# Patient Record
Sex: Male | Born: 1960 | Race: White | Hispanic: No | Marital: Single | Smoking: Current every day smoker
Health system: Southern US, Community
[De-identification: ages and names within clinical notes are randomized; demographics above are authoritative.]

---

## 2016-03-02 ENCOUNTER — Emergency Department (HOSPITAL_COMMUNITY): Payer: Self-pay

## 2016-03-02 ENCOUNTER — Encounter (HOSPITAL_COMMUNITY): Payer: Self-pay | Admitting: Neurology

## 2016-03-02 ENCOUNTER — Emergency Department (HOSPITAL_COMMUNITY)
Admission: EM | Admit: 2016-03-02 | Discharge: 2016-03-02 | Disposition: A | Discharge status: Home / Self Care | Payer: Self-pay | Attending: Emergency Medicine | Admitting: Emergency Medicine

## 2016-03-02 DIAGNOSIS — S8265XA Nondisplaced fracture of lateral malleolus of left fibula, initial encounter for closed fracture: Secondary | ICD-10-CM | POA: Insufficient documentation

## 2016-03-02 DIAGNOSIS — Y939 Activity, unspecified: Secondary | ICD-10-CM | POA: Insufficient documentation

## 2016-03-02 DIAGNOSIS — S82832A Other fracture of upper and lower end of left fibula, initial encounter for closed fracture: Secondary | ICD-10-CM

## 2016-03-02 DIAGNOSIS — W009XXA Unspecified fall due to ice and snow, initial encounter: Secondary | ICD-10-CM | POA: Insufficient documentation

## 2016-03-02 DIAGNOSIS — Y929 Unspecified place or not applicable: Secondary | ICD-10-CM | POA: Insufficient documentation

## 2016-03-02 DIAGNOSIS — Y999 Unspecified external cause status: Secondary | ICD-10-CM | POA: Insufficient documentation

## 2016-03-02 DIAGNOSIS — F17200 Nicotine dependence, unspecified, uncomplicated: Secondary | ICD-10-CM | POA: Insufficient documentation

## 2016-03-02 MED ORDER — HYDROCODONE-ACETAMINOPHEN 5-325 MG PO TABS: 2.0000 | ORAL_TABLET | Freq: Four times a day (QID) | ORAL | 0 refills | Status: AC | PRN

## 2016-03-02 NOTE — ED Provider Notes (Signed)
[facility]-EMERGENCY DEPT Provider Note   CSN: [id number] Arrival date & time: 03/02/16  [id number]     History   Chief Complaint No chief complaint on file.   HPI Aaron Fischer is a 56 y.o. male.  HPI   56 year old male presenting for evaluation of left ankle injury. Patient report yesterday afternoon he accidentally stepped on a curb and injured his left ankle. He twisted his left ankle inward from impact. Denies falling or hitting his head. Complaining of acute onset of sharp throbbing pain to his ankle which has become increasingly swollen. He has been able to ambulate on his ankle with some discomfort. Pain is nonradiating, he did not hear any cracks or pops and denies any numbness. No pain to his knee are hip. He tried Tylenol at home with minimal relief. Currently rates pain as 8 out of 10.  No past medical history on file.  There are no active problems to display for this patient.   No past surgical history on file.     Home Medications    Prior to Admission medications   Not on File    Family History No family history on file.  Social History Social History  Substance Use Topics  . Smoking status: Not on file  . Smokeless tobacco: Not on file  . Alcohol use Not on file     Allergies   Patient has no allergy information on record.   Review of Systems Review of Systems  Constitutional: Negative for fever.  Musculoskeletal: Positive for arthralgias.  Skin: Negative for rash and wound.  Neurological: Negative for numbness.     Physical Exam Updated Vital Signs There were no vitals taken for this visit.  Physical Exam  Constitutional: He appears well-developed and well-nourished. No distress.  HENT:  Head: Atraumatic.  Eyes: Conjunctivae are normal.  Neck: Neck supple.  Musculoskeletal: He exhibits tenderness (Left ankle: Tenderness mostly in the ventral lateral malleolus region with surrounding skin edema but no break in skin, no crepitus and no  obvious deformity. Intact dorsalis pedis pulse with brisk cap refill.).  Left knee and left hips are nontender on palpation.  Neurological: He is alert.  Skin: No rash noted.  Psychiatric: He has a normal mood and affect.  Nursing note and vitals reviewed.    ED Treatments / Results  Labs (all labs ordered are listed, but only abnormal results are displayed) Labs Reviewed - No data to display  EKG  EKG Interpretation None       Radiology Dg Ankle Complete Left  Result Date: 03/02/2016 CLINICAL DATA:  inj fell on ice last night ,lots of swelling and pain lat side lt ankle EXAM: LEFT ANKLE COMPLETE - 3+ VIEW COMPARISON:  None. FINDINGS: Horizontal fracture through the tip of the lateral malleolus with minimal displacement. Ankle mortise intact. Talar dome is normal. No fracture calcaneus. Plantar spurring. IMPRESSION: Nondisplaced fracture of the lateral malleolus Electronically Signed   By: Genevive BiStewart  Edmunds M.D.   On: 03/02/2016 10:06    Procedures Procedures (including critical care time)  Medications Ordered in ED Medications - No data to display   Initial Impression / Assessment and Plan / ED Course  I have reviewed the triage vital signs and the nursing notes.  Pertinent labs & imaging results that were available during my care of the patient were reviewed by me and considered in my medical decision making (see chart for details).     BP 144/96 (BP Location: Right Arm)  Pulse 84   Temp 97.9 F (36.6 C) (Oral)   Resp 16   Ht 5\' 7"  (1.702 m)   Wt 68 kg   SpO2 99%   BMI 23.49 kg/m    Final Clinical Impressions(s) / ED Diagnoses   Final diagnoses:  Closed fracture of distal end of left fibula, unspecified fracture morphology, initial encounter    New Prescriptions New Prescriptions   HYDROCODONE-ACETAMINOPHEN (NORCO/VICODIN) 5-325 MG TABLET    Take 2 tablets by mouth every 6 (six) hours as needed for moderate pain.   10:22 AM Patient here with injury to  left ankle. X-ray of the ankle demonstrates nondisplaced fracture of the lateral malleolus. No tenderness to fifth metatarsal region on left foot. No other injury. He is neurovascularly intact. This is a closed injury. Patient placed in a posterior splint, crutches provided, orthopedic referral given. Pain medication prescribed.  In order to decrease risk of narcotic abuse. Pt's record were checked using the [place] Controlled Substance database.    Fayrene Helper, PA-C 03/02/16 [id number]    Jerelyn Scott, MD 03/02/16 [phone]

## 2016-03-02 NOTE — Progress Notes (Signed)
Orthopedic Tech Progress Note Patient Details:  Rise PaganiniCarter Jimmy Dubois [birth year] [id number]  Ortho Devices Type of Ortho Device: Crutches, Post (short leg) splint Ortho Device/Splint Location: lle Ortho Device/Splint Interventions: Ordered, Application   Trinna PostMartinez, Soliyana Mcchristian J 03/02/2016, 11:43 AM

## 2016-03-02 NOTE — ED Triage Notes (Signed)
Pt reports last night he twisted his left ankle on a curb while walking. Swelling to lateral ankle.

## 2016-03-02 NOTE — Discharge Instructions (Signed)
Wear splint and use crutches as treatment of your broken ankle.  Follow up with Orthopedist in 1 week for further care.  Take vicodin as needed for pain.  You may also take ibuprofen for break through pain.

## 2016-03-02 NOTE — ED Notes (Signed)
Patient transported to X-ray 

## 2016-03-21 ENCOUNTER — Other Ambulatory Visit: Payer: Self-pay | Admitting: Orthopedic Surgery

## 2016-03-21 ENCOUNTER — Ambulatory Visit
Admission: RE | Admit: 2016-03-21 | Discharge: 2016-03-21 | Disposition: A | Discharge status: Home / Self Care | Payer: Self-pay | Source: Ambulatory Visit | Attending: Orthopedic Surgery | Admitting: Orthopedic Surgery

## 2016-03-21 DIAGNOSIS — M25572 Pain in left ankle and joints of left foot: Secondary | ICD-10-CM

## 2016-04-11 ENCOUNTER — Other Ambulatory Visit: Payer: Self-pay | Admitting: Orthopedic Surgery

## 2016-04-11 ENCOUNTER — Ambulatory Visit
Admission: RE | Admit: 2016-04-11 | Discharge: 2016-04-11 | Disposition: A | Discharge status: Home / Self Care | Payer: Self-pay | Source: Ambulatory Visit | Attending: Orthopedic Surgery | Admitting: Orthopedic Surgery

## 2016-04-11 DIAGNOSIS — M25572 Pain in left ankle and joints of left foot: Secondary | ICD-10-CM

## 2016-05-01 ENCOUNTER — Ambulatory Visit
Admission: RE | Admit: 2016-05-01 | Discharge: 2016-05-01 | Disposition: A | Discharge status: Home / Self Care | Payer: Self-pay | Source: Ambulatory Visit | Attending: Orthopedic Surgery | Admitting: Orthopedic Surgery

## 2016-05-01 ENCOUNTER — Other Ambulatory Visit: Payer: Self-pay | Admitting: Orthopedic Surgery

## 2016-05-01 DIAGNOSIS — S8265XA Nondisplaced fracture of lateral malleolus of left fibula, initial encounter for closed fracture: Secondary | ICD-10-CM

## 2018-01-11 IMAGING — DX DG ANKLE COMPLETE 3+V*L*
3 series · 3 of 3 positions shown · non-contrast
Comparison: None.

CLINICAL DATA: inj fell on ice last night ,lots of swelling and
pain lat side lt ankle

EXAM:
LEFT ANKLE COMPLETE - 3+ VIEW

[x ankle ap left]
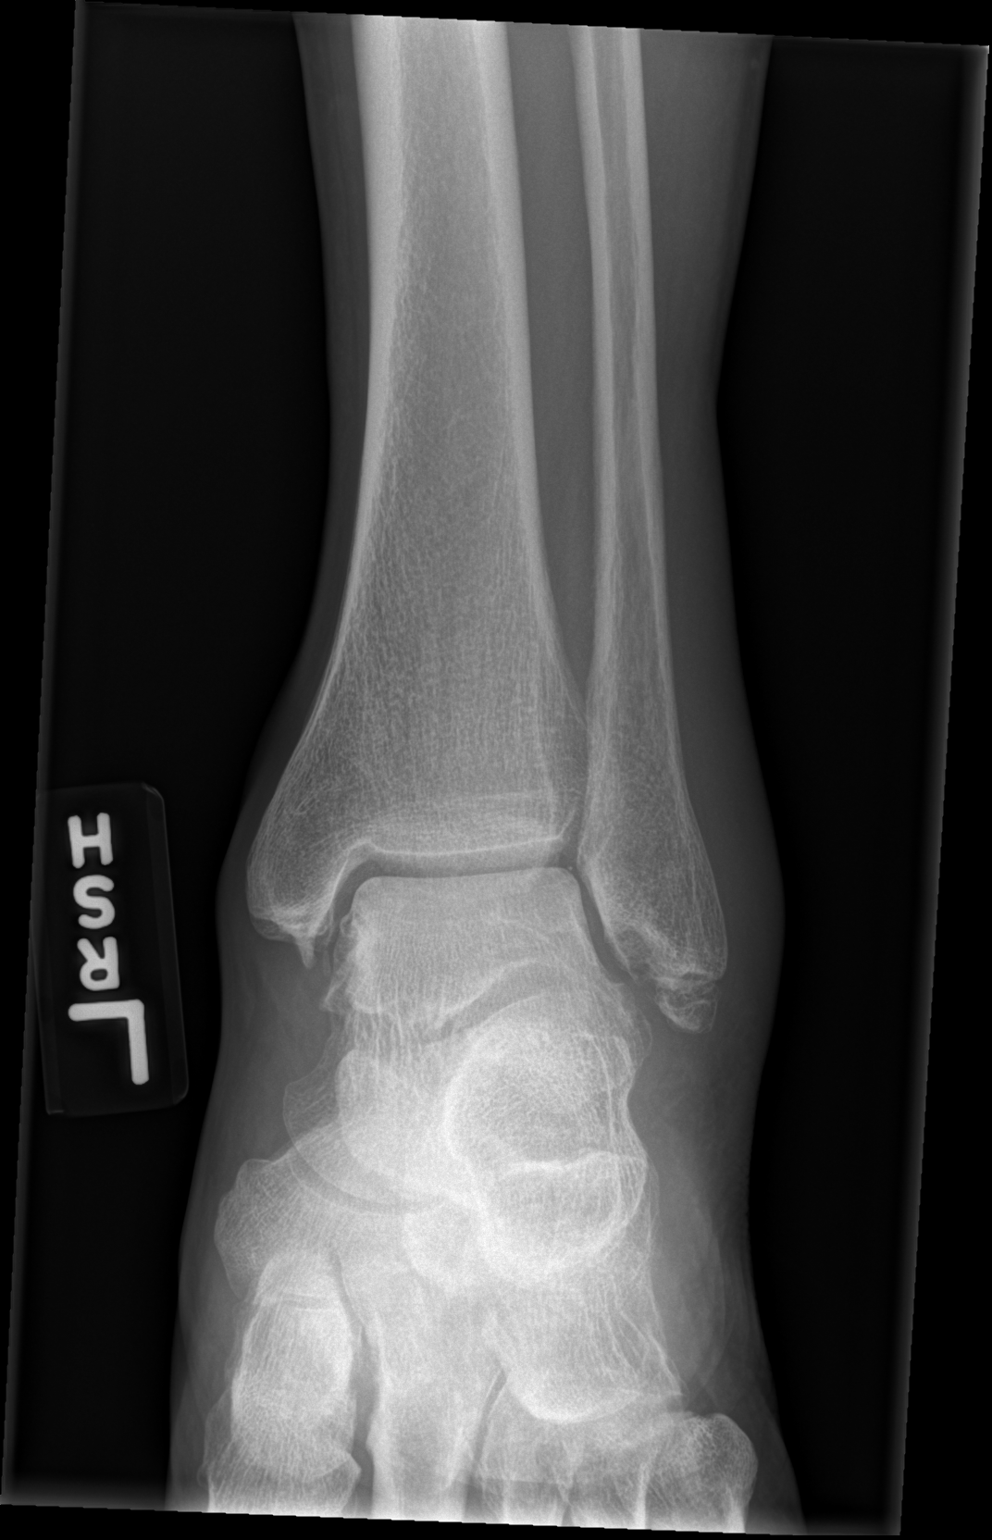

[x ankle obl left]
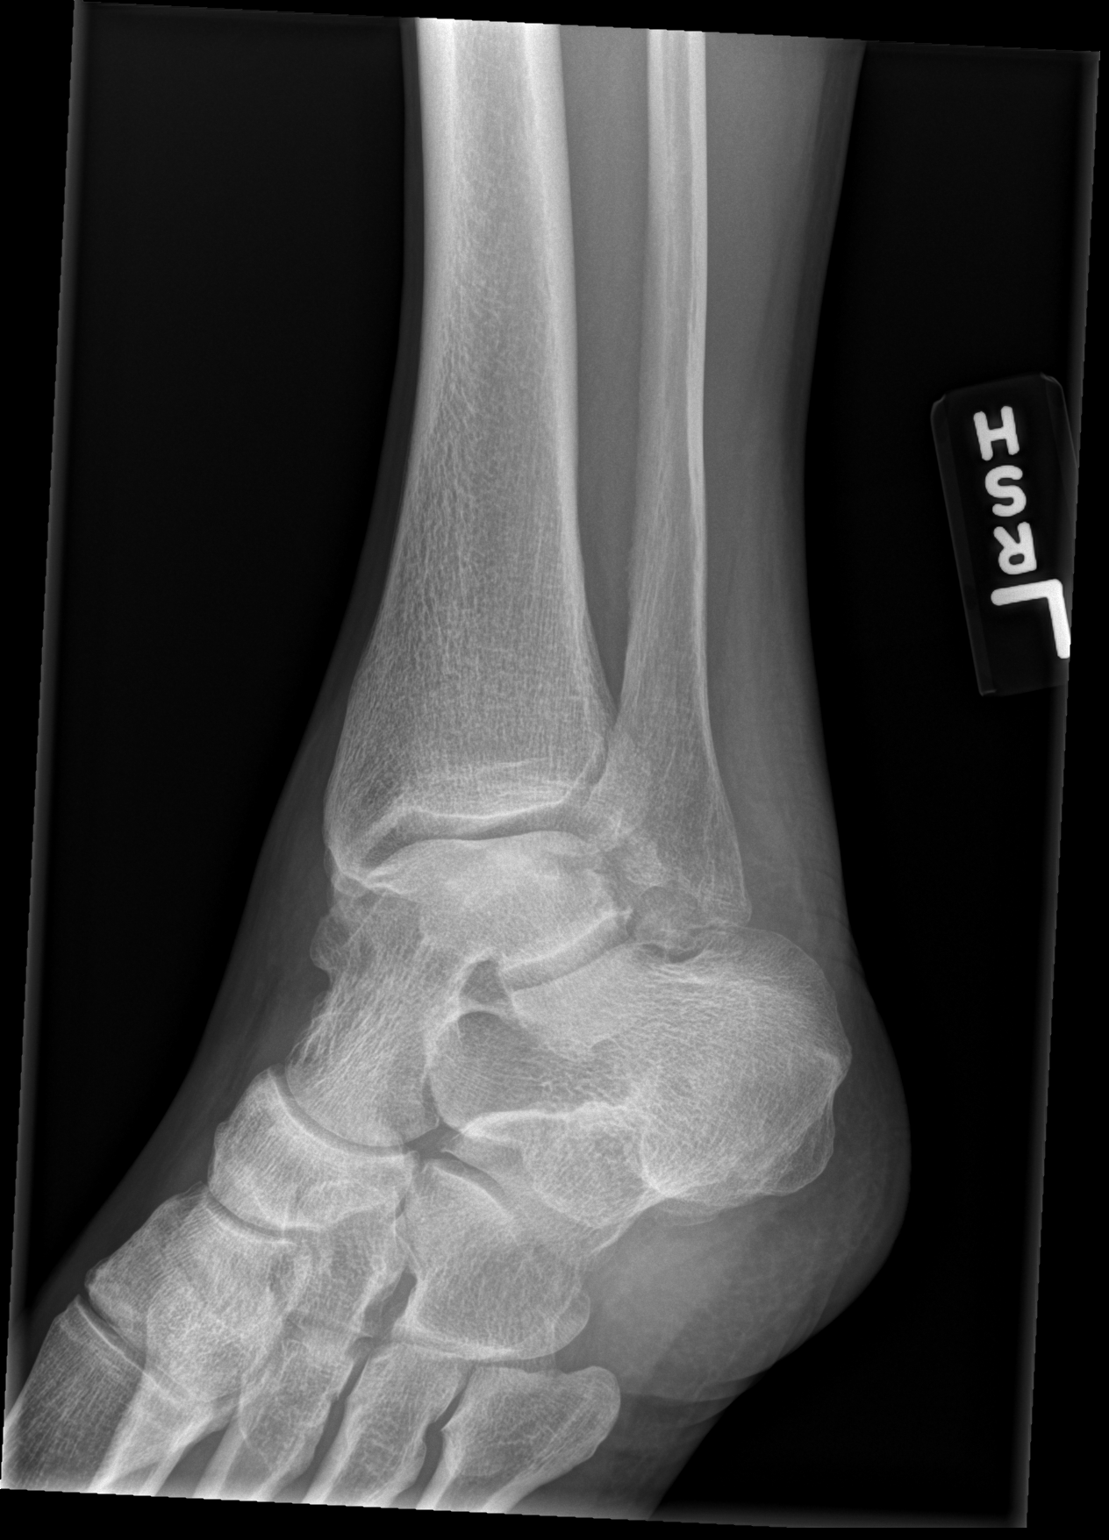

[x ankle lat left]
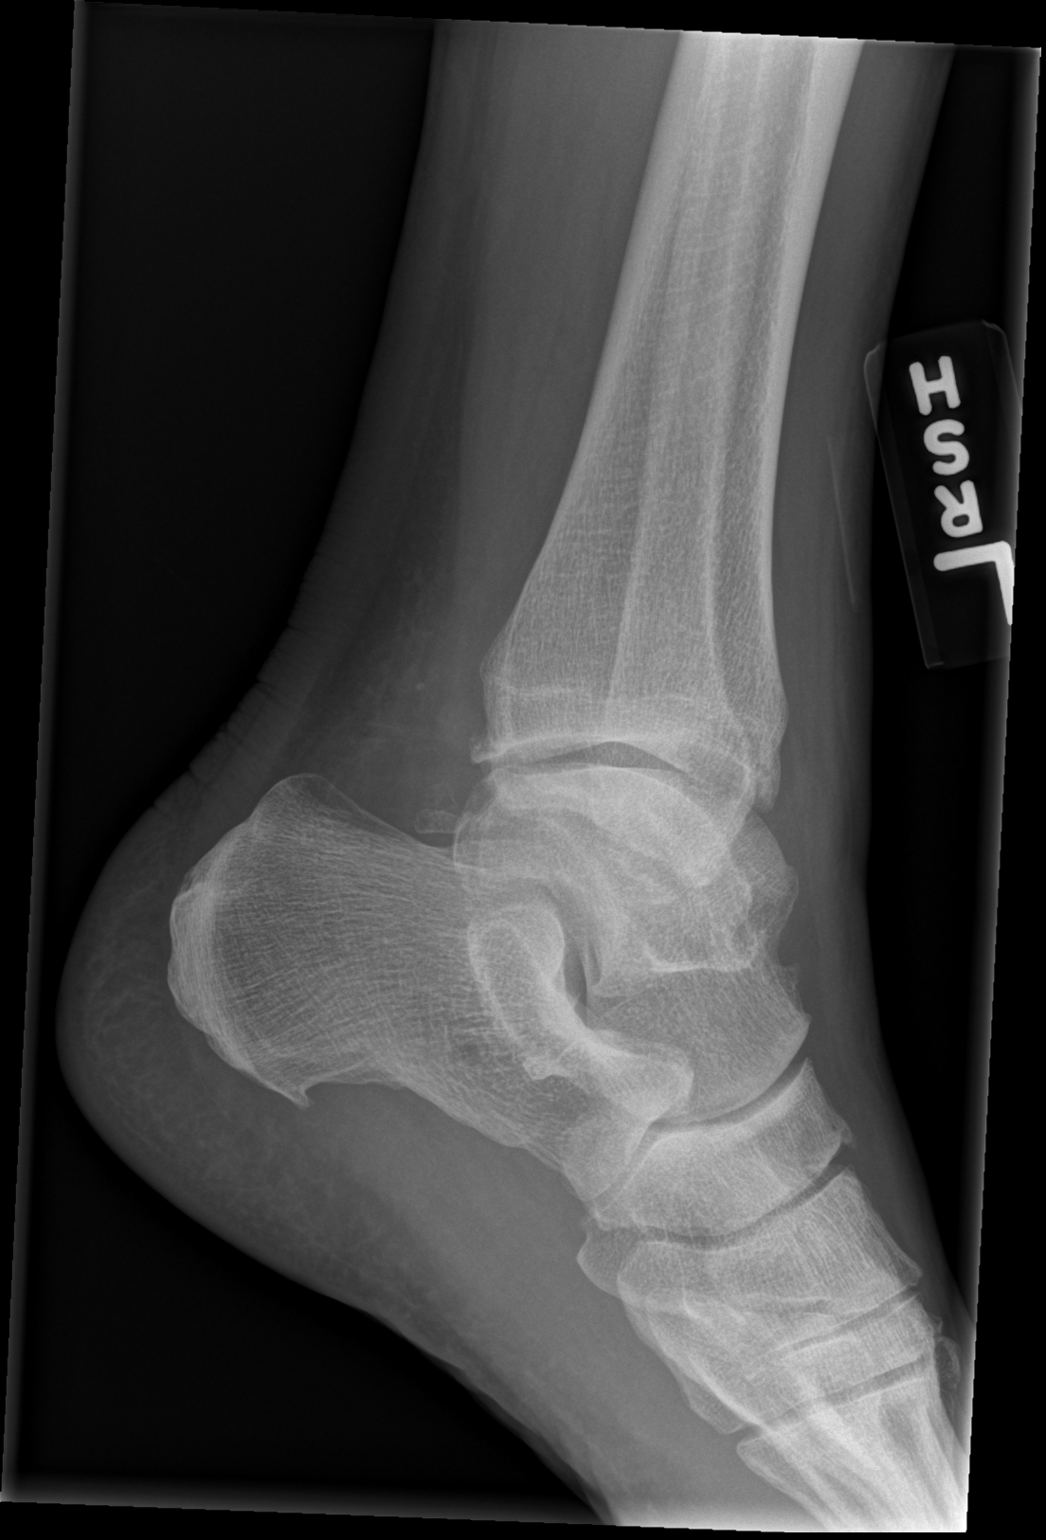

[3 of 3 positions shown; findings below may reference images not displayed]

FINDINGS: Horizontal fracture through the tip of the lateral malleolus with
minimal displacement. Ankle mortise intact. Talar dome is normal. No
fracture calcaneus. Plantar spurring.
IMPRESSION: Nondisplaced fracture of the lateral malleolus

## 2018-02-20 IMAGING — CR DG ANKLE COMPLETE 3+V*L*
3 series · 3 of 3 positions shown · non-contrast
Comparison: 03/02/2016 .

CLINICAL DATA: Pain and injury.

EXAM:
LEFT ANKLE COMPLETE - 3+ VIEW

[x ankle ap left]
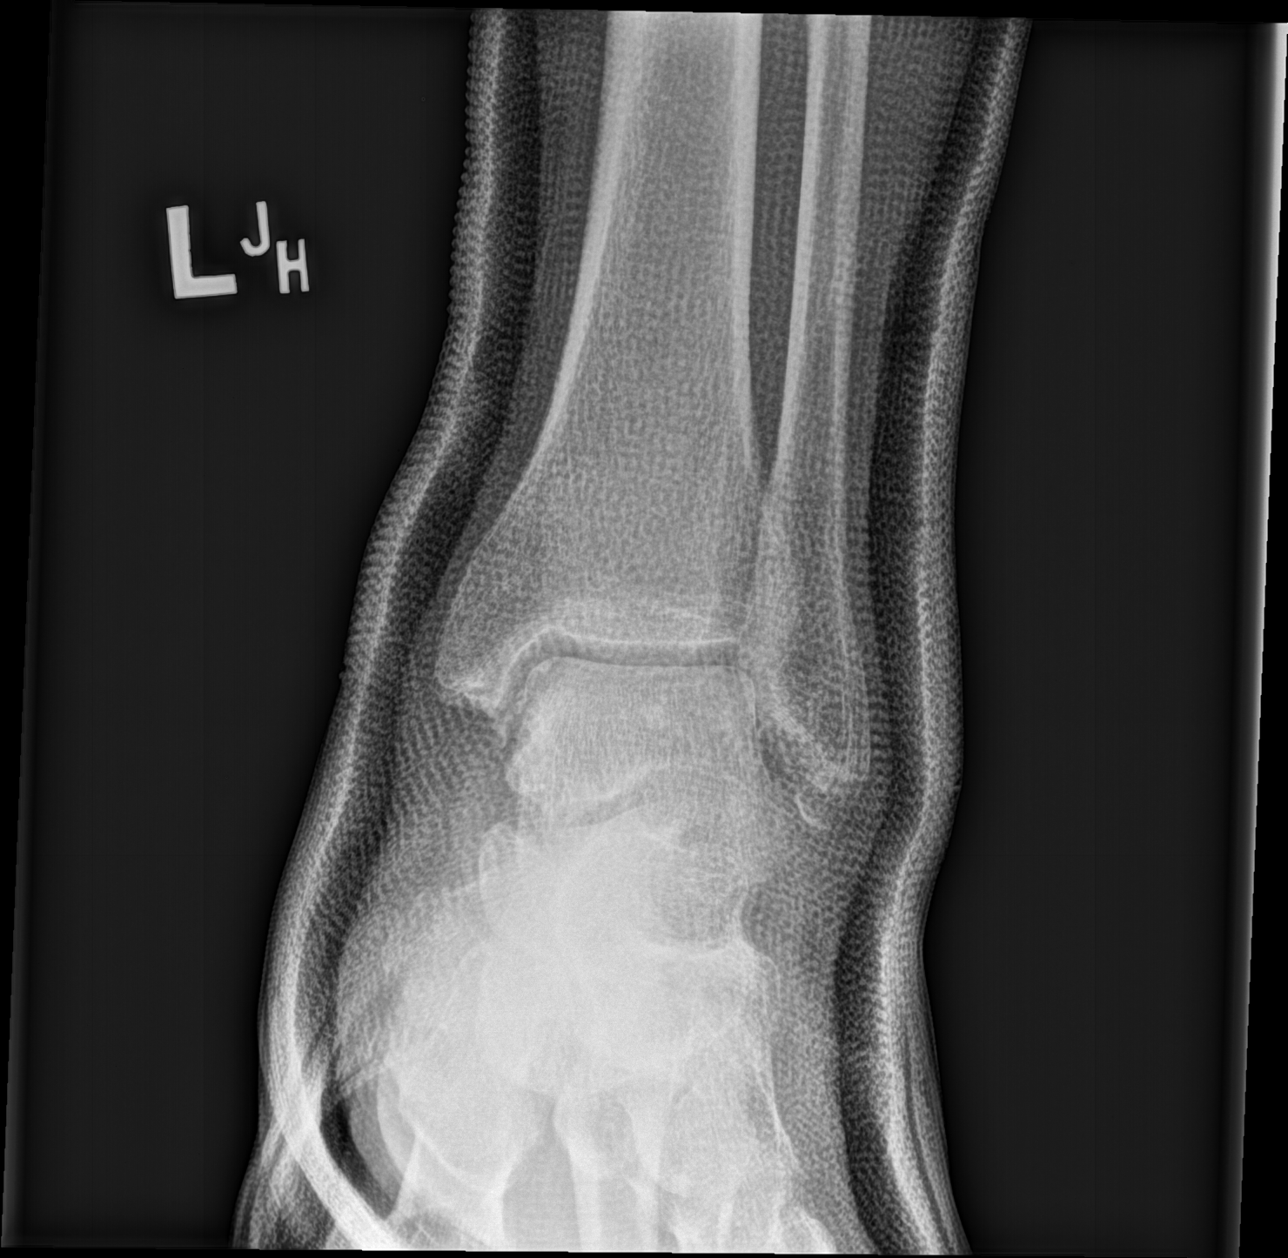

[x ankle obl left]
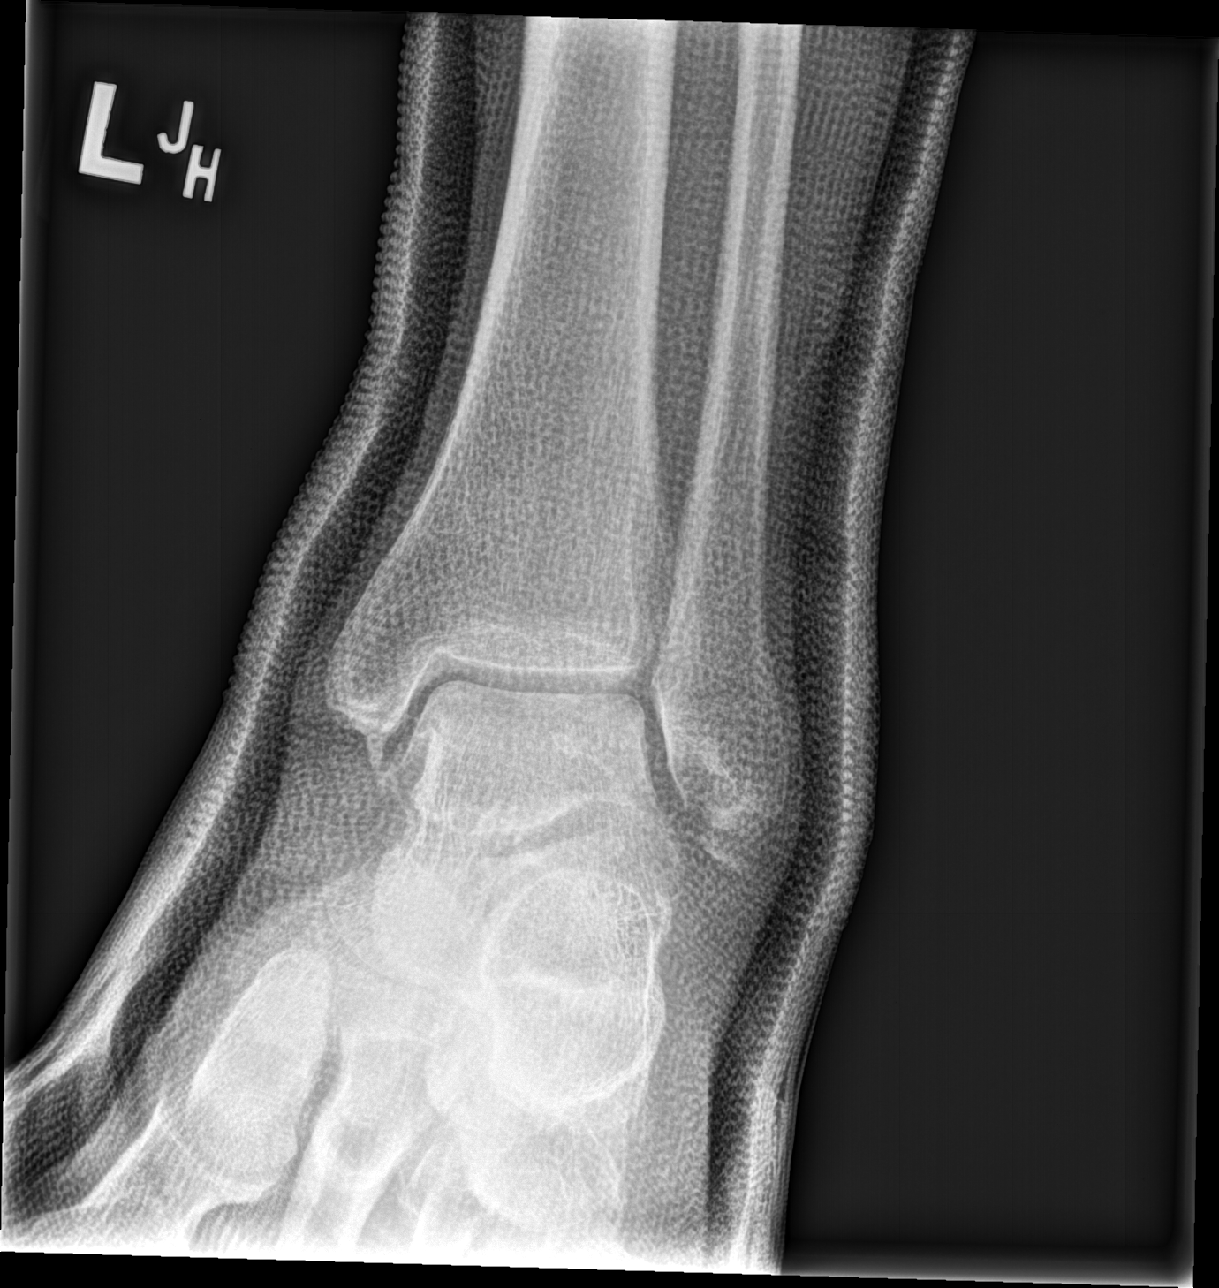

[x ankle lat left]
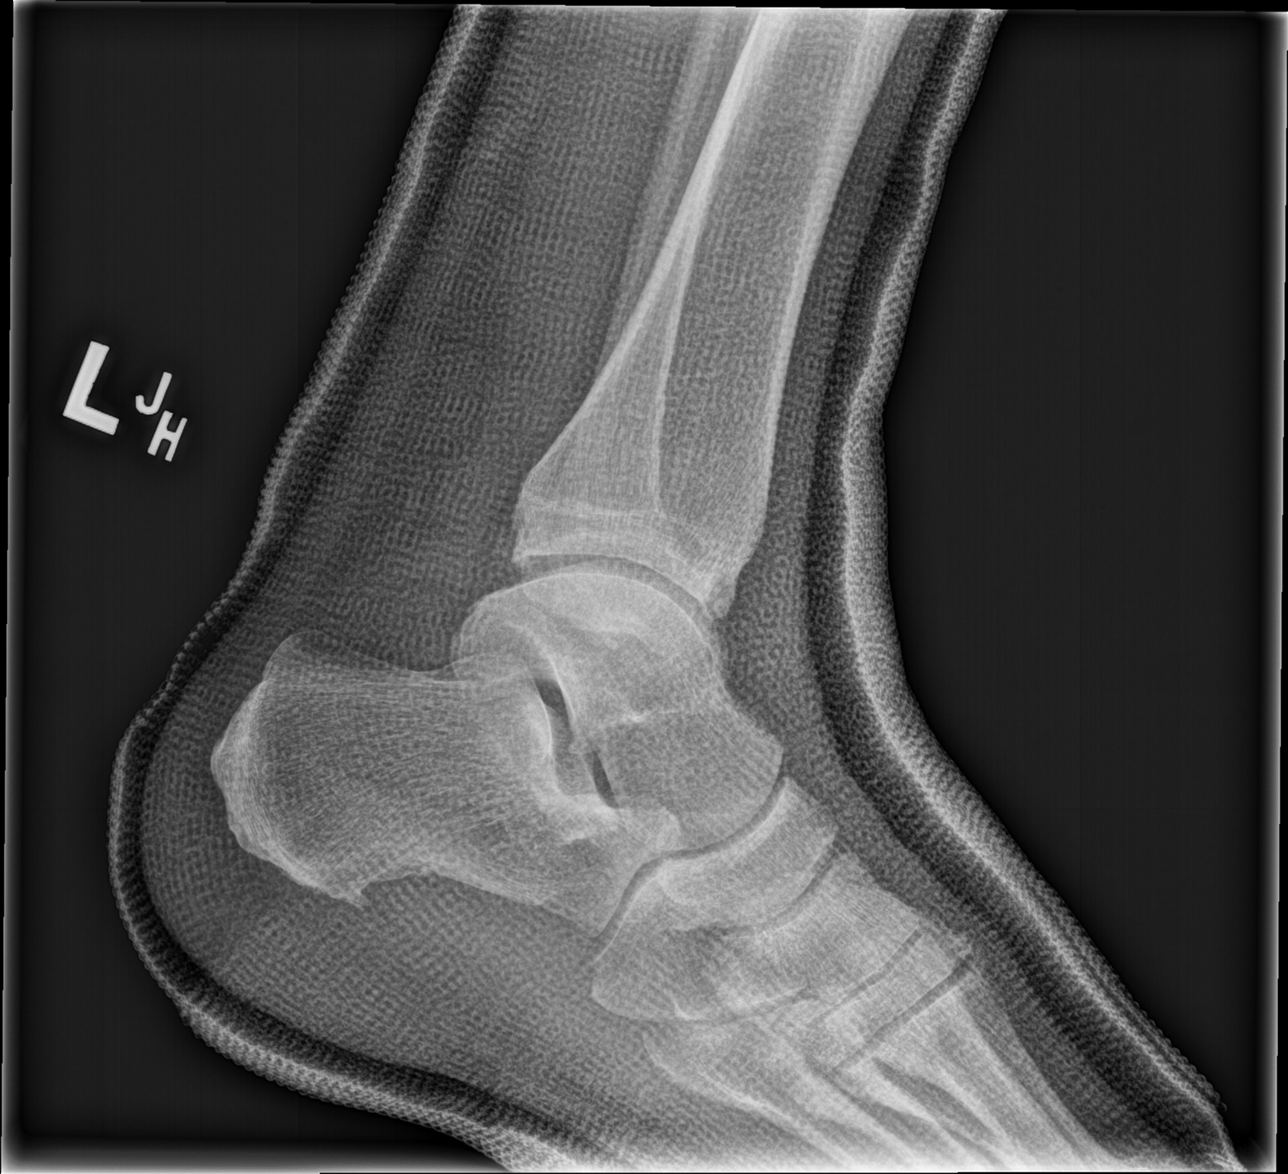

[3 of 3 positions shown; findings below may reference images not displayed]

FINDINGS: Left ankle cast is noted. Fracture of the lateral malleolus is
unchanged in appearance. Degenerative changes about the left ankle .
IMPRESSION: Fracture of the lateral malleolus is unchanged in appearance.
Patient is casted.

## 2019-07-03 DIAGNOSIS — Z01818 Encounter for other preprocedural examination: Secondary | ICD-10-CM
